# Patient Record
Sex: Male | Born: 1995 | Race: Black or African American | Hispanic: No | Marital: Single | Smoking: Never smoker
Health system: Southern US, Community
[De-identification: ages and names within clinical notes are randomized; demographics above are authoritative.]

## PROBLEM LIST (undated history)

## (undated) HISTORY — PX: TESTICLE SURGERY: SHX794

---
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
## 2018-05-25 ENCOUNTER — Encounter (HOSPITAL_COMMUNITY): Payer: Self-pay | Admitting: Emergency Medicine

## 2018-05-25 ENCOUNTER — Emergency Department (HOSPITAL_COMMUNITY)
Admission: EM | Admit: 2018-05-25 | Discharge: 2018-05-26 | Disposition: A | Discharge status: Home / Self Care | Payer: Self-pay | Attending: Emergency Medicine | Admitting: Emergency Medicine

## 2018-05-25 ENCOUNTER — Other Ambulatory Visit: Payer: Self-pay

## 2018-05-25 ENCOUNTER — Emergency Department (HOSPITAL_COMMUNITY): Payer: Self-pay

## 2018-05-25 DIAGNOSIS — N342 Other urethritis: Secondary | ICD-10-CM | POA: Insufficient documentation

## 2018-05-25 DIAGNOSIS — R55 Syncope and collapse: Secondary | ICD-10-CM

## 2018-05-25 DIAGNOSIS — J45909 Unspecified asthma, uncomplicated: Secondary | ICD-10-CM | POA: Insufficient documentation

## 2018-05-25 DIAGNOSIS — R369 Urethral discharge, unspecified: Secondary | ICD-10-CM

## 2018-05-25 LAB — CBC
HCT: 43.8 % (ref 39.0–52.0)
HEMOGLOBIN: 14.2 g/dL (ref 13.0–17.0)
MCH: 27.2 pg (ref 26.0–34.0)
MCHC: 32.4 g/dL (ref 30.0–36.0)
MCV: 83.7 fL (ref 80.0–100.0)
NRBC: 0 % (ref 0.0–0.2)
PLATELETS: 207 10*3/uL (ref 150–400)
RBC: 5.23 MIL/uL (ref 4.22–5.81)
RDW: 12.6 % (ref 11.5–15.5)
WBC: 5.5 10*3/uL (ref 4.0–10.5)

## 2018-05-25 LAB — URINALYSIS, ROUTINE W REFLEX MICROSCOPIC
BILIRUBIN URINE: NEGATIVE
Bacteria, UA: NONE SEEN
Glucose, UA: NEGATIVE mg/dL
Hgb urine dipstick: NEGATIVE
KETONES UR: 5 mg/dL — AB
Nitrite: NEGATIVE
PH: 6 (ref 5.0–8.0)
Protein, ur: 100 mg/dL — AB
SPECIFIC GRAVITY, URINE: 1.03 (ref 1.005–1.030)

## 2018-05-25 LAB — BASIC METABOLIC PANEL
Anion gap: 9 (ref 5–15)
BUN: 12 mg/dL (ref 6–20)
CALCIUM: 9.3 mg/dL (ref 8.9–10.3)
CO2: 26 mmol/L (ref 22–32)
Chloride: 103 mmol/L (ref 98–111)
Creatinine, Ser: 0.97 mg/dL (ref 0.61–1.24)
GLUCOSE: 105 mg/dL — AB (ref 70–99)
POTASSIUM: 3.3 mmol/L — AB (ref 3.5–5.1)
SODIUM: 138 mmol/L (ref 135–145)

## 2018-05-25 MED ORDER — AZITHROMYCIN 1 G PO PACK
1.0000 g | PACK | Freq: Once | ORAL | Status: AC
  Administered 2018-05-25: 1 g via ORAL
  Filled 2018-05-25: qty 1

## 2018-05-25 MED ORDER — ACETAMINOPHEN 325 MG PO TABS
650.0000 mg | ORAL_TABLET | Freq: Once | ORAL | Status: AC
  Administered 2018-05-25: 650 mg via ORAL
  Filled 2018-05-25: qty 2

## 2018-05-25 MED ORDER — STERILE WATER FOR INJECTION IJ SOLN
INTRAMUSCULAR | Status: AC
  Filled 2018-05-25: qty 10

## 2018-05-25 MED ORDER — CEFTRIAXONE SODIUM 250 MG IJ SOLR
250.0000 mg | Freq: Once | INTRAMUSCULAR | Status: AC
  Administered 2018-05-25: 250 mg via INTRAMUSCULAR
  Filled 2018-05-25: qty 250

## 2018-05-25 MED ORDER — SODIUM CHLORIDE 0.9 % IV BOLUS
1000.0000 mL | Freq: Once | INTRAVENOUS | Status: AC
  Administered 2018-05-25: 1000 mL via INTRAVENOUS

## 2018-05-25 MED ORDER — DOXYCYCLINE HYCLATE 100 MG PO CAPS: 100.0000 mg | ORAL_CAPSULE | Freq: Two times a day (BID) | ORAL | 0 refills | Status: DC

## 2018-05-25 MED ORDER — DOXYCYCLINE HYCLATE 100 MG PO TABS
100.0000 mg | ORAL_TABLET | Freq: Once | ORAL | Status: AC
  Administered 2018-05-25: 100 mg via ORAL
  Filled 2018-05-25: qty 1

## 2018-05-25 NOTE — Discharge Instructions (Signed)
Take doxycycline twice daily for a week.   We sent off gonorrhea and chlamydia probe that will take several days to come back. You are treated for both. You will be called if results are positive and your partner will need treatment.   Use condoms   See your doctor  Return to ER if you have fever, abdominal pain, passing out, trouble urinating.

## 2018-05-25 NOTE — ED Notes (Signed)
Pt transported to xray 

## 2018-05-25 NOTE — ED Provider Notes (Signed)
[facility] EMERGENCY DEPARTMENT Provider Note   CSN: [id number] Arrival date & time: 05/25/18  2113     History   Chief Complaint Chief Complaint  Patient presents with  . Loss of Consciousness    HPI Bobby Brooks is a 22 y.o. male history of asthma here presenting with near syncope.  Patient states that he did have sex yesterday with a male friend.  He states that he came back from work today and took a nap and woke up and went to the restroom and noticed that he had whitish discharge from his penis.  He then had hot flashes and passed out twice.  He states that whenever he stands up he feels lightheaded and dizzy.  Denies room spinning.  He also had some low-grade temperature at home and has some nonproductive cough as well.  Moreover, patient has some dysuria as well.  Has any abdominal pain or vomiting.  He denies having previous history of STDs in the past. He doesn't know if the male friend has any STDs.   The history is provided by the patient.    Past Medical History:  Diagnosis Date  . Asthma     There are no active problems to display for this patient.   Past Surgical History:  Procedure Laterality Date  . TESTICLE SURGERY          Home Medications    Prior to Admission medications   Medication Sig Start Date End Date Taking? Authorizing Provider  clindamycin (CLEOCIN) 300 MG capsule 1 tab po tid x 10 days 02/16/12   Viviano Simas, NP  ibuprofen (ADVIL,MOTRIN) 200 MG tablet Take 400 mg by mouth every 6 (six) hours as needed. For pain    [provider]    Family History No family history on file.  Social History Social History   Tobacco Use  . Smoking status: Never Smoker  . Smokeless tobacco: Never Used  Substance Use Topics  . Alcohol use: No  . Drug use: No     Allergies   Patient has no known allergies.   Review of Systems Review of Systems  Cardiovascular: Positive for syncope.  Genitourinary:  Positive for discharge.  Neurological: Positive for dizziness.  All other systems reviewed and are negative.    Physical Exam Updated Vital Signs BP 135/73 (BP Location: Right Arm)   Pulse 85   Temp 99.2 F (37.3 C) (Oral)   Resp 14   SpO2 99%   Physical Exam  Constitutional: He is oriented to person, place, and time.  Slightly anxious   HENT:  Head: Normocephalic.  Mouth/Throat: Oropharynx is clear and moist.  Eyes: Pupils are equal, round, and reactive to light. Conjunctivae and EOM are normal.  Neck: Normal range of motion. Neck supple.  Cardiovascular: Normal rate, regular rhythm and normal heart sounds.  Pulmonary/Chest: Effort normal and breath sounds normal. No stridor. No respiratory distress.  Abdominal: Soft. Bowel sounds are normal. He exhibits no distension. There is no tenderness.  Genitourinary:  Genitourinary Comments: Whitish discharge on the tip of penis. No rash on penis or testicles, no enlarged pelvic lymph nodes   Musculoskeletal: Normal range of motion.  Neurological: He is alert and oriented to person, place, and time.  Skin: Skin is warm. Capillary refill takes less than 2 seconds.  Psychiatric: He has a normal mood and affect.  Nursing note and vitals reviewed.    ED Treatments / Results  Labs (all labs ordered are listed,  but only abnormal results are displayed) Labs Reviewed  BASIC METABOLIC PANEL - Abnormal; Notable for the following components:      Result Value   Potassium 3.3 (*)    Glucose, Bld 105 (*)    All other components within normal limits  URINALYSIS, ROUTINE W REFLEX MICROSCOPIC - Abnormal; Notable for the following components:   Color, Urine AMBER (*)    APPearance HAZY (*)    Ketones, ur 5 (*)    Protein, ur 100 (*)    Leukocytes, UA MODERATE (*)    WBC, UA >50 (*)    All other components within normal limits  URINE CULTURE  CBC  TROPONIN I  HIV ANTIBODY (ROUTINE TESTING W REFLEX)  RAPID HIV SCREEN (HIV 1/2 AB+AG)    GC/CHLAMYDIA PROBE AMP ([place]) NOT AT [facility]    EKG EKG Interpretation  Date/Time:  Wednesday May 25 2018 21:17:59 EST Ventricular Rate:  87 PR Interval:  150 QRS Duration: 90 QT Interval:  338 QTC Calculation: 406 R Axis:   76 Text Interpretation:  Normal sinus rhythm Normal ECG No previous ECGs available Confirmed by Richardean Canal [phone]) on 05/25/2018 9:44:01 PM   Radiology Dg Chest 2 View  Result Date: 05/25/2018 CLINICAL DATA:  Cough and fever EXAM: CHEST - 2 VIEW COMPARISON:  11/09/2003 FINDINGS: The heart size and mediastinal contours are within normal limits. Both lungs are clear. The visualized skeletal structures are unremarkable. IMPRESSION: Clear lungs. Electronically Signed   By: Deatra Robinson M.D.   On: 05/25/2018 22:47    Procedures Procedures (including critical care time)  Medications Ordered in ED Medications  sterile water (preservative free) injection (has no administration in time range)  sodium chloride 0.9 % bolus 1,000 mL (0 mLs Intravenous Stopped 05/25/18 2327)  cefTRIAXone (ROCEPHIN) injection 250 mg (250 mg Intramuscular Given 05/25/18 2321)  azithromycin (ZITHROMAX) powder 1 g (1 g Oral Given 05/25/18 2321)  acetaminophen (TYLENOL) tablet 650 mg (650 mg Oral Given 05/25/18 2322)  doxycycline (VIBRA-TABS) tablet 100 mg (100 mg Oral Given 05/25/18 2322)     Initial Impression / Assessment and Plan / ED Course  I have reviewed the triage vital signs and the nursing notes.  Pertinent labs & imaging results that were available during my care of the patient were reviewed by me and considered in my medical decision making (see chart for details).    Bobby Brooks is a 22 y.o. male here with penile discharge, near syncope. I think near syncope likely vasovagal vs orthostatic vs anxiety vs dehydration. No head injury or LOC. He had sex last night so will treat for STDs empirically. Will get labs, orthostatic, UA, GC/chlamydia.   11:43 PM Labs  unremarkable. UA + WBC and leukocyte but no obvious bacteria. CXR normal. I think patient likely has STD causing his low grade temp, dysuria, penile discharge. Given rocephin, azithro. Will dc home with doxycycline. Told him to use condoms and that he will be called if results are positive and that his partner may need treatment as well.    Final Clinical Impressions(s) / ED Diagnoses   Final diagnoses:  None    ED Discharge Orders    None       Charlynne Pander, MD 05/25/18 2345

## 2018-05-25 NOTE — ED Triage Notes (Signed)
Pt states he passed out x 2 today. States he felt dizzy prior to syncopal events. Denies chest pain/shortness of breath.

## 2018-05-26 LAB — GC/CHLAMYDIA PROBE AMP (~~LOC~~) NOT AT ARMC
Chlamydia: NEGATIVE
NEISSERIA GONORRHEA: POSITIVE — AB

## 2018-05-26 LAB — TROPONIN I: Troponin I: <0.03 ng/mL (ref <0.03)

## 2018-05-26 LAB — RAPID HIV SCREEN (HIV 1/2 AB+AG)
HIV 1/2 Antibodies: NONREACTIVE
HIV-1 P24 ANTIGEN - HIV24: NONREACTIVE

## 2018-05-26 LAB — HIV ANTIBODY (ROUTINE TESTING W REFLEX): HIV SCREEN 4TH GENERATION: NONREACTIVE

## 2018-05-27 LAB — URINE CULTURE: Culture: <10000 — AB

## 2018-08-10 ENCOUNTER — Encounter (HOSPITAL_COMMUNITY): Payer: Self-pay | Admitting: Emergency Medicine

## 2018-08-10 ENCOUNTER — Emergency Department (HOSPITAL_COMMUNITY)
Admission: EM | Admit: 2018-08-10 | Discharge: 2018-08-10 | Disposition: A | Discharge status: Home / Self Care | Payer: Self-pay | Attending: Emergency Medicine | Admitting: Emergency Medicine

## 2018-08-10 ENCOUNTER — Emergency Department (HOSPITAL_COMMUNITY): Payer: Self-pay

## 2018-08-10 DIAGNOSIS — S62342A Nondisplaced fracture of base of third metacarpal bone, right hand, initial encounter for closed fracture: Secondary | ICD-10-CM | POA: Insufficient documentation

## 2018-08-10 DIAGNOSIS — Z23 Encounter for immunization: Secondary | ICD-10-CM | POA: Insufficient documentation

## 2018-08-10 DIAGNOSIS — J45909 Unspecified asthma, uncomplicated: Secondary | ICD-10-CM | POA: Insufficient documentation

## 2018-08-10 DIAGNOSIS — Y929 Unspecified place or not applicable: Secondary | ICD-10-CM | POA: Insufficient documentation

## 2018-08-10 DIAGNOSIS — Y999 Unspecified external cause status: Secondary | ICD-10-CM | POA: Insufficient documentation

## 2018-08-10 DIAGNOSIS — W2209XA Striking against other stationary object, initial encounter: Secondary | ICD-10-CM | POA: Insufficient documentation

## 2018-08-10 DIAGNOSIS — Y939 Activity, unspecified: Secondary | ICD-10-CM | POA: Insufficient documentation

## 2018-08-10 MED ORDER — OXYCODONE-ACETAMINOPHEN 5-325 MG PO TABS: 1.0000 | ORAL_TABLET | Freq: Four times a day (QID) | ORAL | 0 refills | Status: AC | PRN

## 2018-08-10 MED ORDER — NAPROXEN 500 MG PO TABS: 500.0000 mg | ORAL_TABLET | Freq: Two times a day (BID) | ORAL | 0 refills | Status: AC

## 2018-08-10 MED ORDER — TETANUS-DIPHTH-ACELL PERTUSSIS 5-2.5-18.5 LF-MCG/0.5 IM SUSP
0.5000 mL | Freq: Once | INTRAMUSCULAR | Status: AC
  Administered 2018-08-10: 0.5 mL via INTRAMUSCULAR
  Filled 2018-08-10: qty 0.5

## 2018-08-10 MED ORDER — IBUPROFEN 800 MG PO TABS
800.0000 mg | ORAL_TABLET | Freq: Once | ORAL | Status: AC
  Administered 2018-08-10: 800 mg via ORAL
  Filled 2018-08-10: qty 1

## 2018-08-10 NOTE — ED Notes (Signed)
Patient transported to X-ray 

## 2018-08-10 NOTE — ED Notes (Signed)
Patient verbalizes understanding of discharge instructions. Opportunity for questioning and answers were provided. Armband removed by staff, pt discharged from ED ambulatory w/ GF  

## 2018-08-10 NOTE — Discharge Instructions (Addendum)
Please read and follow all provided instructions.  You have been seen today for an injury to your right hand.  Your x-ray shows that you have a fracture of your third metacarpal: A bone in the hand.  We placed you into a splint, please keep this clean and dry, please leave this on until you have followed up with hand surgeon Dr. Melvyn Novas in the next 5 days.  Please call to make an appointment.  Home care instructions: -- *PRICE in the first 24-48 hours after injury: Protect (with brace, splint, sling), if given by your provider Rest Ice- Do not apply ice pack directly to your skin, place towel or similar between your skin and ice/ice pack. Apply ice for 20 min, then remove for 40 min while awake Compression- Wear brace, elastic bandage, splint as directed by your provider Elevate affected extremity above the level of your heart when not walking around for the first 24-48 hours   Medications: - Naproxen is a nonsteroidal anti-inflammatory medication that will help with pain and swelling. Be sure to take this medication as prescribed with food, 1 pill every 12 hours,  It should be taken with food, as it can cause stomach upset, and more seriously, stomach bleeding. Do not take other nonsteroidal anti-inflammatory medications with this such as Advil, Motrin, Aleve, Mobic, Goodie Powder, or Motrin.    - -Percocet-this is a narcotic/controlled substance medication that has potential addicting qualities.  We recommend that you take 1-2 tablets every 6 hours as needed for severe pain.  Do not drive or operate heavy machinery when taking this medicine as it can be sedating. Do not drink alcohol or take other sedating medications when taking this medicine for safety reasons.  Keep this out of reach of small children.  Please be aware this medicine has Tylenol in it (325 mg/tab) do not exceed the maximum dose of Tylenol in a day per over the counter recommendations should you decide to supplement with Tylenol  over the counter.   We have prescribed you new medication(s) today. Discuss the medications prescribed today with your pharmacist as they can have adverse effects and interactions with your other medicines including over the counter and prescribed medications. Seek medical evaluation if you start to experience new or abnormal symptoms after taking one of these medicines, seek care immediately if you start to experience difficulty breathing, feeling of your throat closing, facial swelling, or rash as these could be indications of a more serious allergic reaction   Follow-up instructions: Please follow-up with Dr. Melvyn Novas within 5 days. Call the clinic to make an appointment.   Return instructions:  Please return if your digits or extremity are numb or tingling, appear gray or blue, or you have severe pain (also elevate the extremity and loosen splint or wrap if you were given one) Please return if you have redness or fevers.  Please return to the Emergency Department if you experience worsening symptoms.  Please return if you have any other emergent concerns. Additional Information:  Your vital signs today were: BP 134/77    Pulse 83    Temp 97.8 F (36.6 C) (Oral)    Resp 15    SpO2 100%  If your blood pressure (BP) was elevated above 135/85 this visit, please have this repeated by your doctor within one month. ---------------

## 2018-08-10 NOTE — ED Provider Notes (Signed)
[facility] EMERGENCY DEPARTMENT Provider Note   CSN: [id number] Arrival date & time: 08/10/18  1056     History   Chief Complaint Chief Complaint  Patient presents with  . Hand Injury    HPI Bobby Brooks is a 23 y.o. male with a hx of asthma who presents to the ED s/p R hand injury yesterday evening with complaints of right hand pain and swelling. Patient states he became frustrated by something and punched the ground. Pain/swelling since injury. Pain is 10/10 in severity, worse with movement, no alleviating factors, no intervention PTA. No other injuries. 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 Unknown last tetanus. Denies fever, chills, numbness, weakness, or paresthesias. Patient is R hand dominant. Denies other areas of injury.   HPI  Past Medical History:  Diagnosis Date  . Asthma     There are no active problems to display for this patient.   Past Surgical History:  Procedure Laterality Date  . TESTICLE SURGERY          Home Medications    Prior to Admission medications   Medication Sig Start Date End Date Taking? Authorizing Provider  clindamycin (CLEOCIN) 300 MG capsule 1 tab po tid x 10 days 02/16/12   Viviano Simas, NP  doxycycline (VIBRAMYCIN) 100 MG capsule Take 1 capsule (100 mg total) by mouth 2 (two) times daily. One po bid x 7 days 05/25/18   Charlynne Pander, MD  ibuprofen (ADVIL,MOTRIN) 200 MG tablet Take 400 mg by mouth every 6 (six) hours as needed. For pain    [provider]    Family History No family history on file.  Social History Social History   Tobacco Use  . Smoking status: Never Smoker  . Smokeless tobacco: Never Used  Substance Use Topics  . Alcohol use: No  . Drug use: No     Allergies   Patient has no known allergies.   Review of Systems Review of Systems  Constitutional: Negative for chills and fever.  Musculoskeletal: Positive for arthralgias and joint  swelling.  Skin: Wound: from landscaping, not acute from injury.  Neurological: Negative for weakness and numbness.       Negative for paresthesias.      Physical Exam Updated Vital Signs BP 134/77   Pulse 83   Temp 97.8 F (36.6 C) (Oral)   Resp 15   SpO2 100%   Physical Exam Vitals signs and nursing note reviewed.  Constitutional:      General: He is not in acute distress.    Appearance: He is well-developed.  HENT:     Head: Normocephalic and atraumatic.  Eyes:     General:        Right eye: No discharge.        Left eye: No discharge.     Conjunctiva/sclera: Conjunctivae normal.  Cardiovascular:     Pulses:          Radial pulses are 2+ on the right side and 2+ on the left side.  Musculoskeletal:     Comments: Upper extremities: A few scabbed over abrasions are present over the dorsum of the R hand without surrounding erythema or purulent drainage. Mild swelling to the dorsum of the R hand.  Intact AROM to bilateral elbows, wrists, and IP/MCP joints. Tender to palpation over the R 2nd/3rd metacarpals as well as carpals just proximal. Otherwise nontender. No snuffbox tenderness. NVI distally.   Skin:    Capillary  Refill: Capillary refill takes less than 2 seconds.  Neurological:     Mental Status: He is alert.     Comments: Clear speech. Sensation grossly intact to bilateral upper extremities. Intact symmetric grip strength.   Psychiatric:        Behavior: Behavior normal.        Thought Content: Thought content normal.    ED Treatments / Results  Labs (all labs ordered are listed, but only abnormal results are displayed) Labs Reviewed - No data to display  EKG None  Radiology Dg Wrist Complete Right  Result Date: 08/10/2018 CLINICAL DATA:  Pain after hitting solid object EXAM: RIGHT WRIST - COMPLETE 3+ VIEW COMPARISON:  None. FINDINGS: Frontal, oblique, lateral, and ulnar deviation scaphoid images were obtained. There is a fracture of the proximal third  metacarpal with alignment essentially anatomic. No other fracture evident. No dislocation. Joint spaces appear normal. No erosive change. IMPRESSION: Fracture third proximal metacarpal with alignment essentially anatomic. No other fracture evident. No dislocation. No appreciable arthropathy. Electronically Signed   By: Bretta Bang III M.D.   On: 08/10/2018 11:51   Dg Hand Complete Right  Result Date: 08/10/2018 CLINICAL DATA:  Pain after hitting solid object EXAM: RIGHT HAND - COMPLETE 3+ VIEW COMPARISON:  None. FINDINGS: Frontal, oblique, and lateral views obtained. There is a fracture of the proximal aspect of the third metacarpal with alignment essentially anatomic. No other evident fracture. No dislocation. Joint spaces appear normal. No erosive change. IMPRESSION: Nondisplaced fracture proximal third metacarpal. No other fracture. No dislocation. No appreciable arthropathy. Electronically Signed   By: Bretta Bang III M.D.   On: 08/10/2018 11:52    Procedures Procedures (including critical care time)  SPLINT APPLICATION Date/Time: 12:25 PM Authorized by: Harvie Heck Consent: Verbal consent obtained. Risks and benefits: risks, benefits and alternatives were discussed Consent given by: patient Splint applied by: orthopedic technician Location details: RUE Splint type: volar Post-procedure: The splinted body part was neurovascularly unchanged following the procedure. Patient tolerance: Patient tolerated the procedure well with no immediate complications.  Medications Ordered in ED Medications  ibuprofen (ADVIL,MOTRIN) tablet 800 mg (has no administration in time range)  Tdap (BOOSTRIX) injection 0.5 mL (0.5 mLs Intramuscular Given 08/10/18 1159)     Initial Impression / Assessment and Plan / ED Course  I have reviewed the triage vital signs and the nursing notes.  Pertinent labs & imaging results that were available during my care of the patient were reviewed by me  and considered in my medical decision making (see chart for details).    Patient presents to the ED with complaints of pain/swelling to the  R hand s/p injury last evening. Exam without obvious deformity or acute open wounds, there is some old scabbed over areas. Soft tissue swelling noted. ROM intact. Tender over 2nd/3rd metacarpals and carpals just proximal, no snuffbox tenderness. NVI distally. Xray with nondisplaced fracture proximal third metacarpal.  12:18: CONSULT: Discussed with Earney Hamburg PA-C on call with hand surgeon Dr. Melvyn Novas- in agreement with volar splint and outpatient follow up.   Splint applied in the ER, remains NVI. Naproxen & percocet for pain. [place] Controlled Substance reporting System queried. Hand surgery follow up.  I discussed results, treatment plan, need for follow-up, and return precautions with the patient. Provided opportunity for questions, patient confirmed understanding and are in agreement with plan.    Final Clinical Impressions(s) / ED Diagnoses   Final diagnoses:  Closed nondisplaced fracture of base of third metacarpal  bone of right hand, initial encounter    ED Discharge Orders         Ordered    naproxen (NAPROSYN) 500 MG tablet  2 times daily     08/10/18 1226    oxyCODONE-acetaminophen (PERCOCET/ROXICET) 5-325 MG tablet  Every 6 hours PRN     08/10/18 [address]1226           Tishara Pizano, BerwindSamantha R, PA-C 08/10/18 1228    Pricilla LovelessGoldston, Scott, MD 08/10/18 1905

## 2018-08-10 NOTE — Progress Notes (Signed)
Orthopedic Tech Progress Note Patient Details:  Bobby Brooks 09/28/1995 [id number]  Ortho Devices Type of Ortho Device: Volar splint Ortho Device/Splint Location: right hand Ortho Device/Splint Interventions: Adjustment, Application, Ordered   Post Interventions Patient Tolerated: Well Instructions Provided: Care of device, Adjustment of device   Donald Pore 08/10/2018, 1:12 PM

## 2018-08-10 NOTE — ED Triage Notes (Signed)
Pt reports R hand pain after punching the ground last night.

## 2018-08-10 NOTE — ED Notes (Signed)
ED Provider at bedside. 

## 2018-08-10 NOTE — Progress Notes (Signed)
Orthopedic Tech Progress Note Patient Details:  Bobby Brooks [birth year] [id number] I clicked on the wrong splint the 1st time. Patient ID: Bobby Brooks, male   DOB: Feb 16, 1996, 22 y.o.   MRN: [id number]   Bobby Brooks 08/10/2018, 1:14 PM

## 2018-09-06 ENCOUNTER — Emergency Department (HOSPITAL_COMMUNITY)
Admission: EM | Admit: 2018-09-06 | Discharge: 2018-09-06 | Disposition: A | Discharge status: Home / Self Care | Payer: Self-pay | Attending: Emergency Medicine | Admitting: Emergency Medicine

## 2018-09-06 ENCOUNTER — Encounter (HOSPITAL_COMMUNITY): Payer: Self-pay | Admitting: Emergency Medicine

## 2018-09-06 DIAGNOSIS — M79641 Pain in right hand: Secondary | ICD-10-CM | POA: Insufficient documentation

## 2018-09-06 DIAGNOSIS — Z5321 Procedure and treatment not carried out due to patient leaving prior to being seen by health care provider: Secondary | ICD-10-CM | POA: Insufficient documentation

## 2018-09-06 NOTE — ED Provider Notes (Cosign Needed)
Patient eloped from the emergency department before provider evaluation. I did not see or evaluate this patient. Triage note states patient has right hand pain.     Carlyle Basques [place], [place] 09/06/18 2318

## 2018-09-06 NOTE — ED Notes (Signed)
Pt called for x3 no answer no response.

## 2018-09-06 NOTE — ED Notes (Signed)
No answer from patient after calling in the waiting room x3.

## 2018-09-06 NOTE — ED Notes (Signed)
Pt left with out telling any one. 

## 2018-09-06 NOTE — ED Triage Notes (Signed)
Pt reports he broke his hand 1 week ago, was unable to do a follow up visit with an orthopedist, wants to have his hand checked again and re-wrapped.

## 2019-04-26 ENCOUNTER — Other Ambulatory Visit: Payer: Self-pay

## 2019-04-26 ENCOUNTER — Ambulatory Visit (HOSPITAL_COMMUNITY)
Admission: EM | Admit: 2019-04-26 | Discharge: 2019-04-26 | Disposition: A | Discharge status: Home / Self Care | Payer: Self-pay | Attending: Family Medicine | Admitting: Family Medicine

## 2019-04-26 ENCOUNTER — Encounter (HOSPITAL_COMMUNITY): Payer: Self-pay

## 2019-04-26 DIAGNOSIS — R369 Urethral discharge, unspecified: Secondary | ICD-10-CM | POA: Insufficient documentation

## 2019-04-26 NOTE — Discharge Instructions (Addendum)
We have sent testing for sexually transmitted infections. We will notify you of any positive results once they are received. If required, we will prescribe any medications you might need.  Please refrain from all sexual activity for at least the next seven days.  

## 2019-04-26 NOTE — ED Triage Notes (Signed)
Pt presents with lower abdominal pain and penile discharge for past few days.

## 2019-04-28 LAB — CYTOLOGY, (ORAL, ANAL, URETHRAL) ANCILLARY ONLY
Chlamydia: POSITIVE — AB
Neisseria Gonorrhea: NEGATIVE
Trichomonas: NEGATIVE

## 2019-04-29 NOTE — ED Provider Notes (Signed)
[facility]   [id number] 04/26/19 Arrival Time: 1405  ASSESSMENT & PLAN:  1. Penile discharge     Declines empiric treatment. Declines HIV/RPR testing.   Discharge Instructions     We have sent testing for sexually transmitted infections. We will notify you of any positive results once they are received. If required, we will prescribe any medications you might need.  Please refrain from all sexual activity for at least the next seven days.     Pending: Labs Reviewed  CYTOLOGY, (ORAL, ANAL, URETHRAL) ANCILLARY ONLY    Will notify of any positive results. Instructed to refrain from sexual activity for at least seven days.  Reviewed expectations re: course of current medical issues. Questions answered. Outlined signs and symptoms indicating need for more acute intervention. Patient verbalized understanding. After Visit Summary given.   SUBJECTIVE:  Bobby Brooks is a 23 y.o. male who presents with complaint of penile discharge. Onset gradual. First noticed on/off for the past week "maybe". Describes discharge as thin and white. Denies: urinary frequency, hematuria, urinary hesitancy, urinary retention, chills and sweats. Afebrile. No abdominal or pelvic pain. No n/v. No rashes or lesions. Reports that he is sexually active with single male partner. OTC treatment: none. History of STI: none reported.  ROS: As per HPI. All other systems negative.   OBJECTIVE:  Vitals:   04/26/19 1537  BP: 128/77  Pulse: 83  Resp: 18  Temp: 98.3 F (36.8 C)  TempSrc: Oral  SpO2: 100%     General appearance: alert, cooperative, appears stated age and no distress Throat: lips, mucosa, and tongue normal; teeth and gums normal CV: RRR Lungs: CTAB Back: no CVA tenderness; FROM at waist Abdomen: soft, non-tender GU: normal appearing genitalia Skin: warm and dry Psychological: alert and cooperative; normal mood and affect.     No Known Allergies  Past Medical  History:  Diagnosis Date  . Asthma    Family History  Family history unknown: Yes   Social History   Socioeconomic History  . Marital status: Single    Spouse name: Not on file  . Number of children: Not on file  . Years of education: Not on file  . Highest education level: Not on file  Occupational History  . Not on file  Social Needs  . Financial resource strain: Not on file  . Food insecurity    Worry: Not on file    Inability: Not on file  . Transportation needs    Medical: Not on file    Non-medical: Not on file  Tobacco Use  . Smoking status: Never Smoker  . Smokeless tobacco: Never Used  Substance and Sexual Activity  . Alcohol use: No  . Drug use: No  . Sexual activity: Not on file  Lifestyle  . Physical activity    Days per week: Not on file    Minutes per session: Not on file  . Stress: Not on file  Relationships  . Social Musician on phone: Not on file    Gets together: Not on file    Attends religious service: Not on file    Active member of club or organization: Not on file    Attends meetings of clubs or organizations: Not on file    Relationship status: Not on file  . Intimate partner violence    Fear of current or ex partner: Not on file    Emotionally abused: Not on file    Physically abused: Not  on file    Forced sexual activity: Not on file  Other Topics Concern  . Not on file  Social History Narrative  . Not on file          Vanessa Kick, MD 04/29/19 [phone]

## 2019-05-01 ENCOUNTER — Ambulatory Visit (HOSPITAL_COMMUNITY): Admission: EM | Admit: 2019-05-01 | Discharge: 2019-05-01 | Discharge status: Against Medical Advice | Payer: Self-pay

## 2019-05-01 ENCOUNTER — Telehealth (HOSPITAL_COMMUNITY): Payer: Self-pay | Admitting: Emergency Medicine

## 2019-05-01 ENCOUNTER — Encounter (HOSPITAL_COMMUNITY): Payer: Self-pay

## 2019-05-01 ENCOUNTER — Other Ambulatory Visit: Payer: Self-pay

## 2019-05-01 MED ORDER — AZITHROMYCIN 250 MG PO TABS: 1000.0000 mg | ORAL_TABLET | Freq: Once | ORAL | 0 refills | Status: AC

## 2019-05-01 NOTE — Telephone Encounter (Signed)
Chlamydia is positive.  Rx po zithromax 1g #1 dose no refills was sent to the pharmacy of record.  Pt needs education to please refrain from sexual intercourse for 7 days to give the medicine time to work, sexual partners need to be notified and tested/treated.  Condoms may reduce risk of reinfection.  Recheck or followup with PCP for further evaluation if symptoms are not improving.   [facility] notified.  Attempted to reach patient. No answer at this time. Voicemail left.    

## 2019-05-02 ENCOUNTER — Telehealth (HOSPITAL_COMMUNITY): Payer: Self-pay | Admitting: Emergency Medicine

## 2019-05-02 NOTE — Telephone Encounter (Signed)
Attempted to reach patient x2. No answer at this time. Voicemail left.    

## 2019-05-04 ENCOUNTER — Telehealth (HOSPITAL_COMMUNITY): Payer: Self-pay | Admitting: Emergency Medicine

## 2019-05-04 NOTE — Telephone Encounter (Signed)
Attempted to reach patient x3. No answer at this time. Voicemail left. Letter sent    

## 2019-06-15 IMAGING — DX DG WRIST COMPLETE 3+V*R*
4 series · 4 of 4 positions shown · non-contrast
Comparison: None.

CLINICAL DATA: Pain after hitting solid object

EXAM:
RIGHT WRIST - COMPLETE 3+ VIEW

[x wrist pa right]
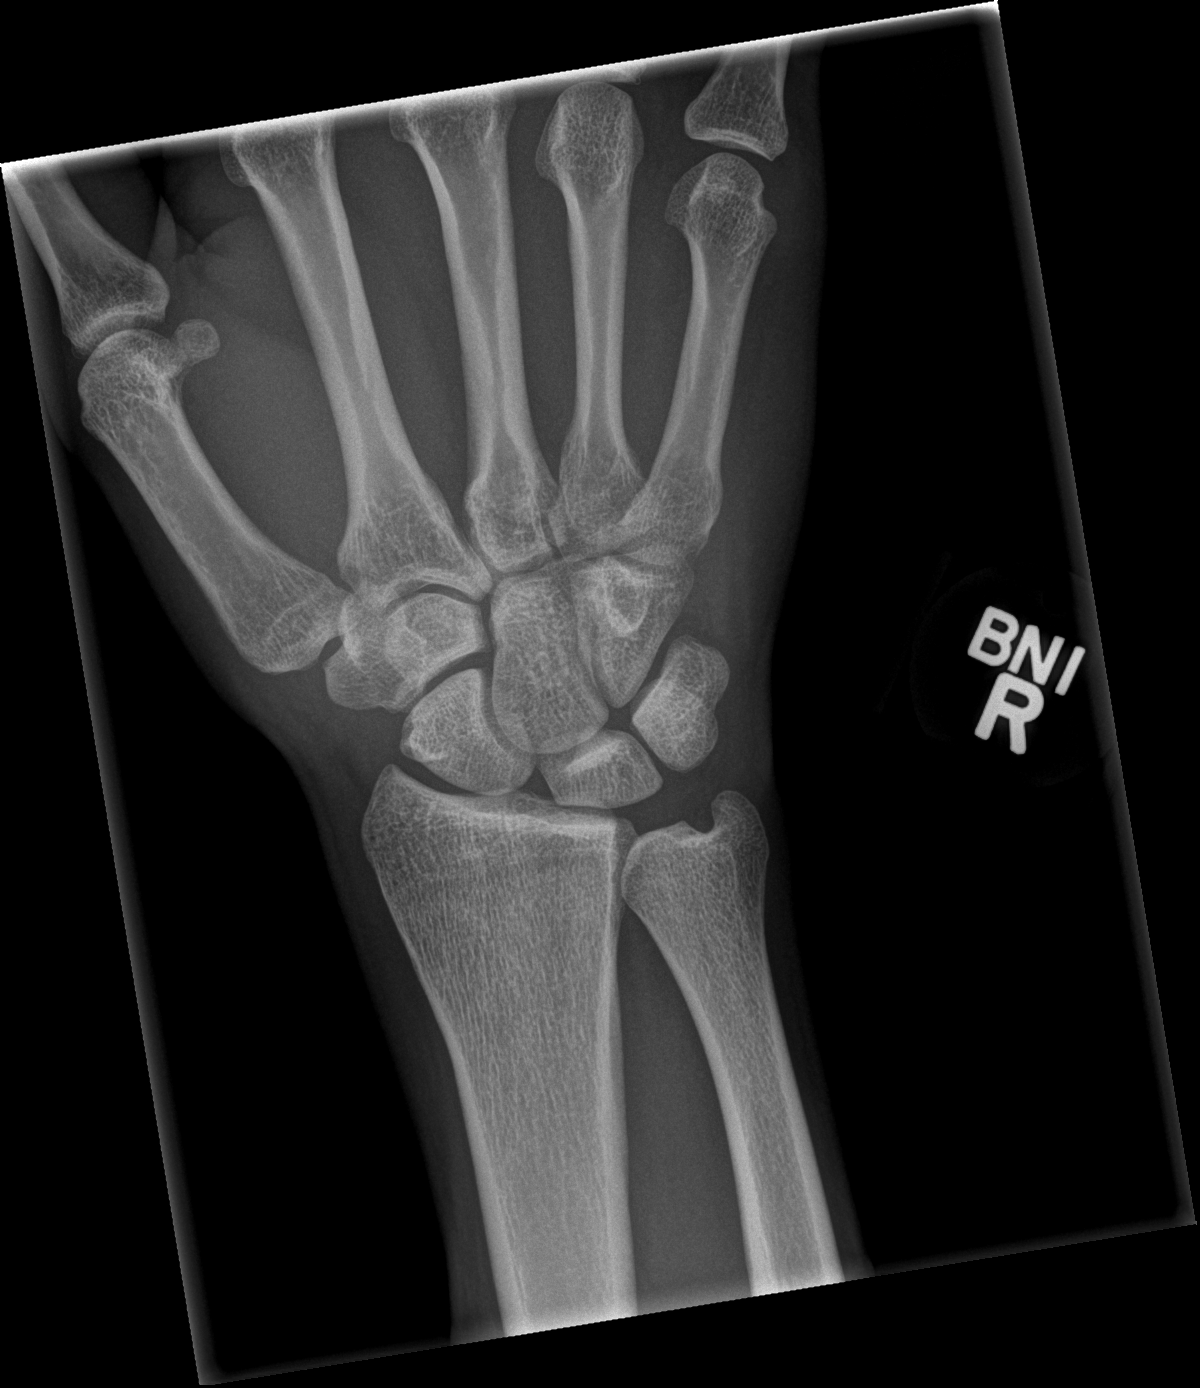

[x wrist obl right]
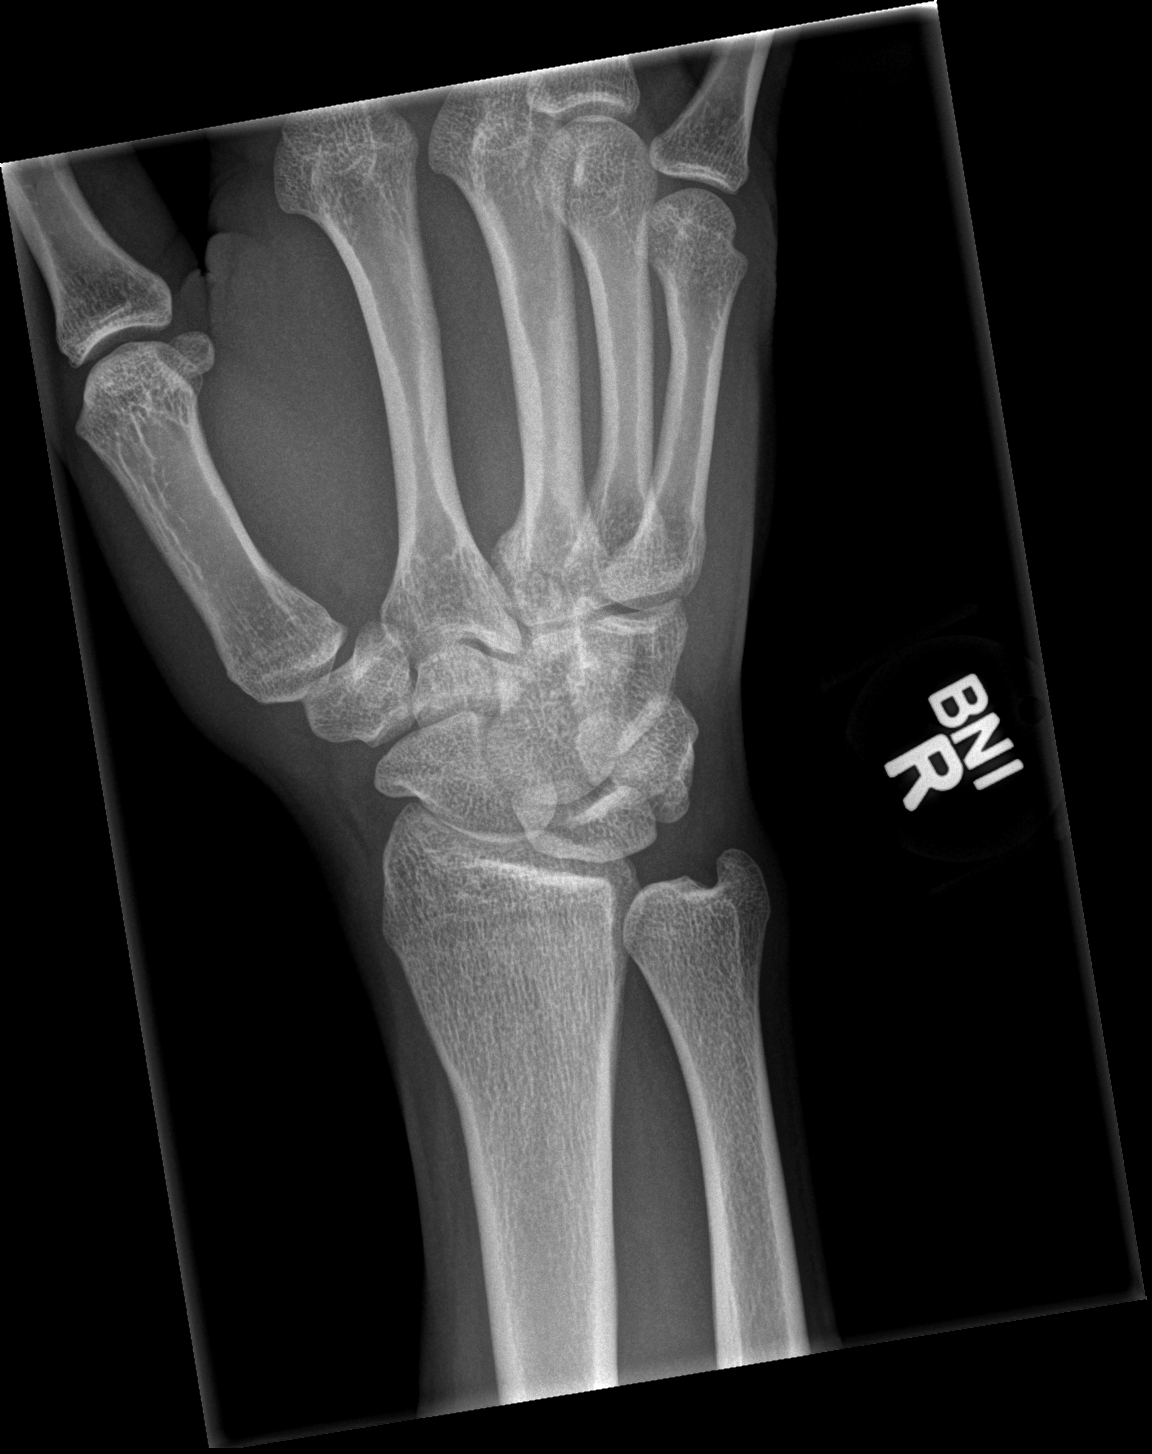

[x wrist lat right]
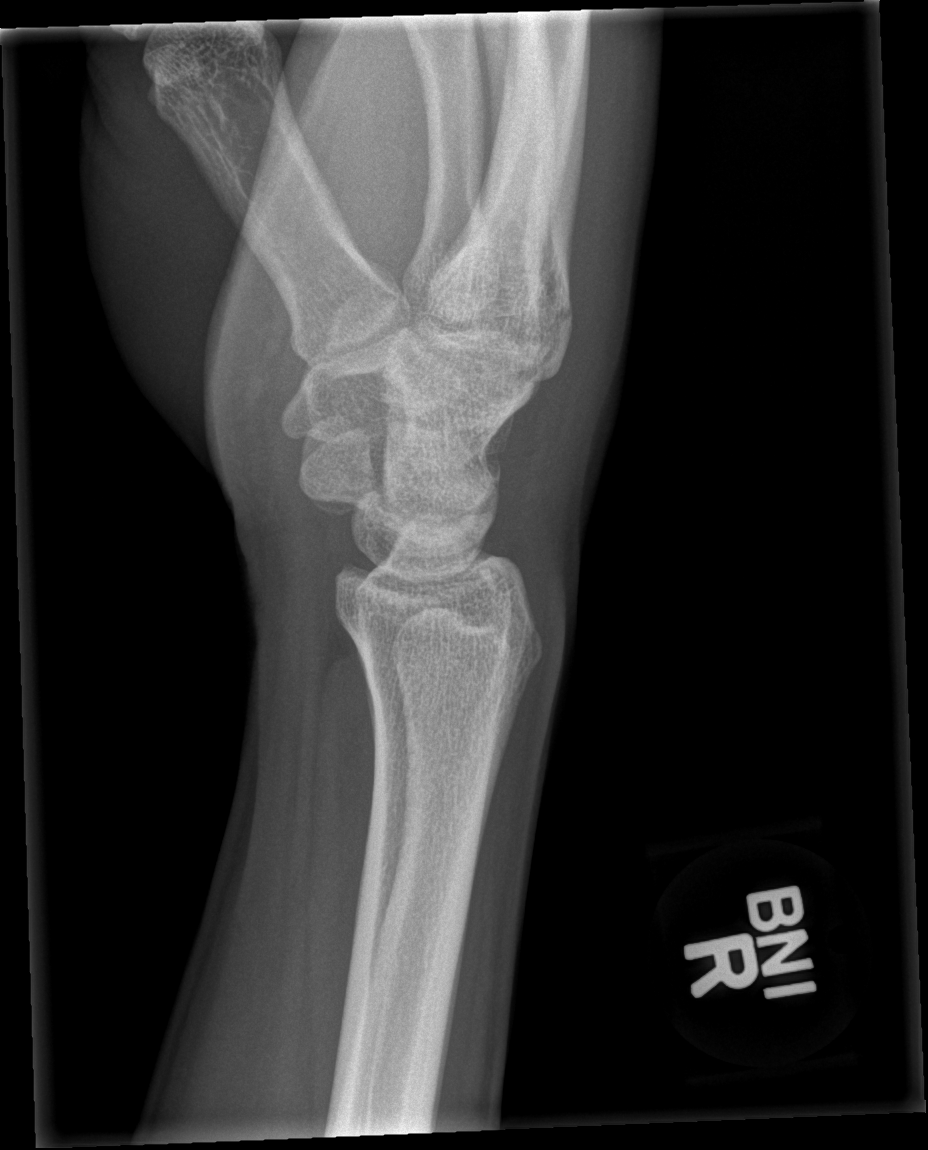

[x wrist navicular view right]
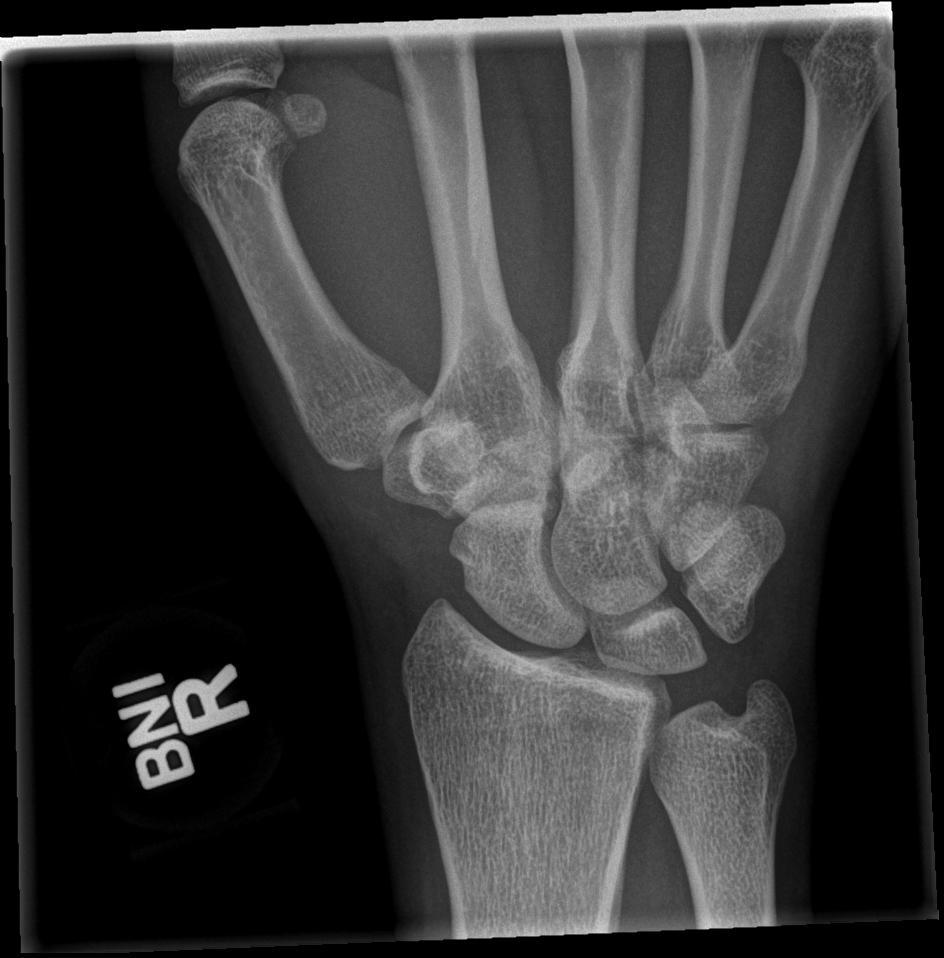

[4 of 4 positions shown; findings below may reference images not displayed]

FINDINGS: Frontal, oblique, lateral, and ulnar deviation scaphoid images were
obtained. There is a fracture of the proximal third metacarpal with
alignment essentially anatomic. No other fracture evident. No
dislocation. Joint spaces appear normal. No erosive change.
IMPRESSION: Fracture third proximal metacarpal with alignment essentially
anatomic. No other fracture evident. No dislocation. No appreciable
arthropathy.
